# Patient Record
Sex: Female | Born: 1995 | Hispanic: No | Marital: Single | Smoking: Never smoker
Health system: Southern US, Community
[De-identification: ages and names within clinical notes are randomized; demographics above are authoritative.]

---
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
## 2016-09-11 IMAGING — CR DG CHEST 2V
2 series · 2 of 2 positions shown · non-contrast
Comparison: None.

CLINICAL DATA: Chest pain for over a year with minimal shortness of
breath. Postpartum for 2 months.

EXAM:
CHEST  2 VIEW

[w chest pa]
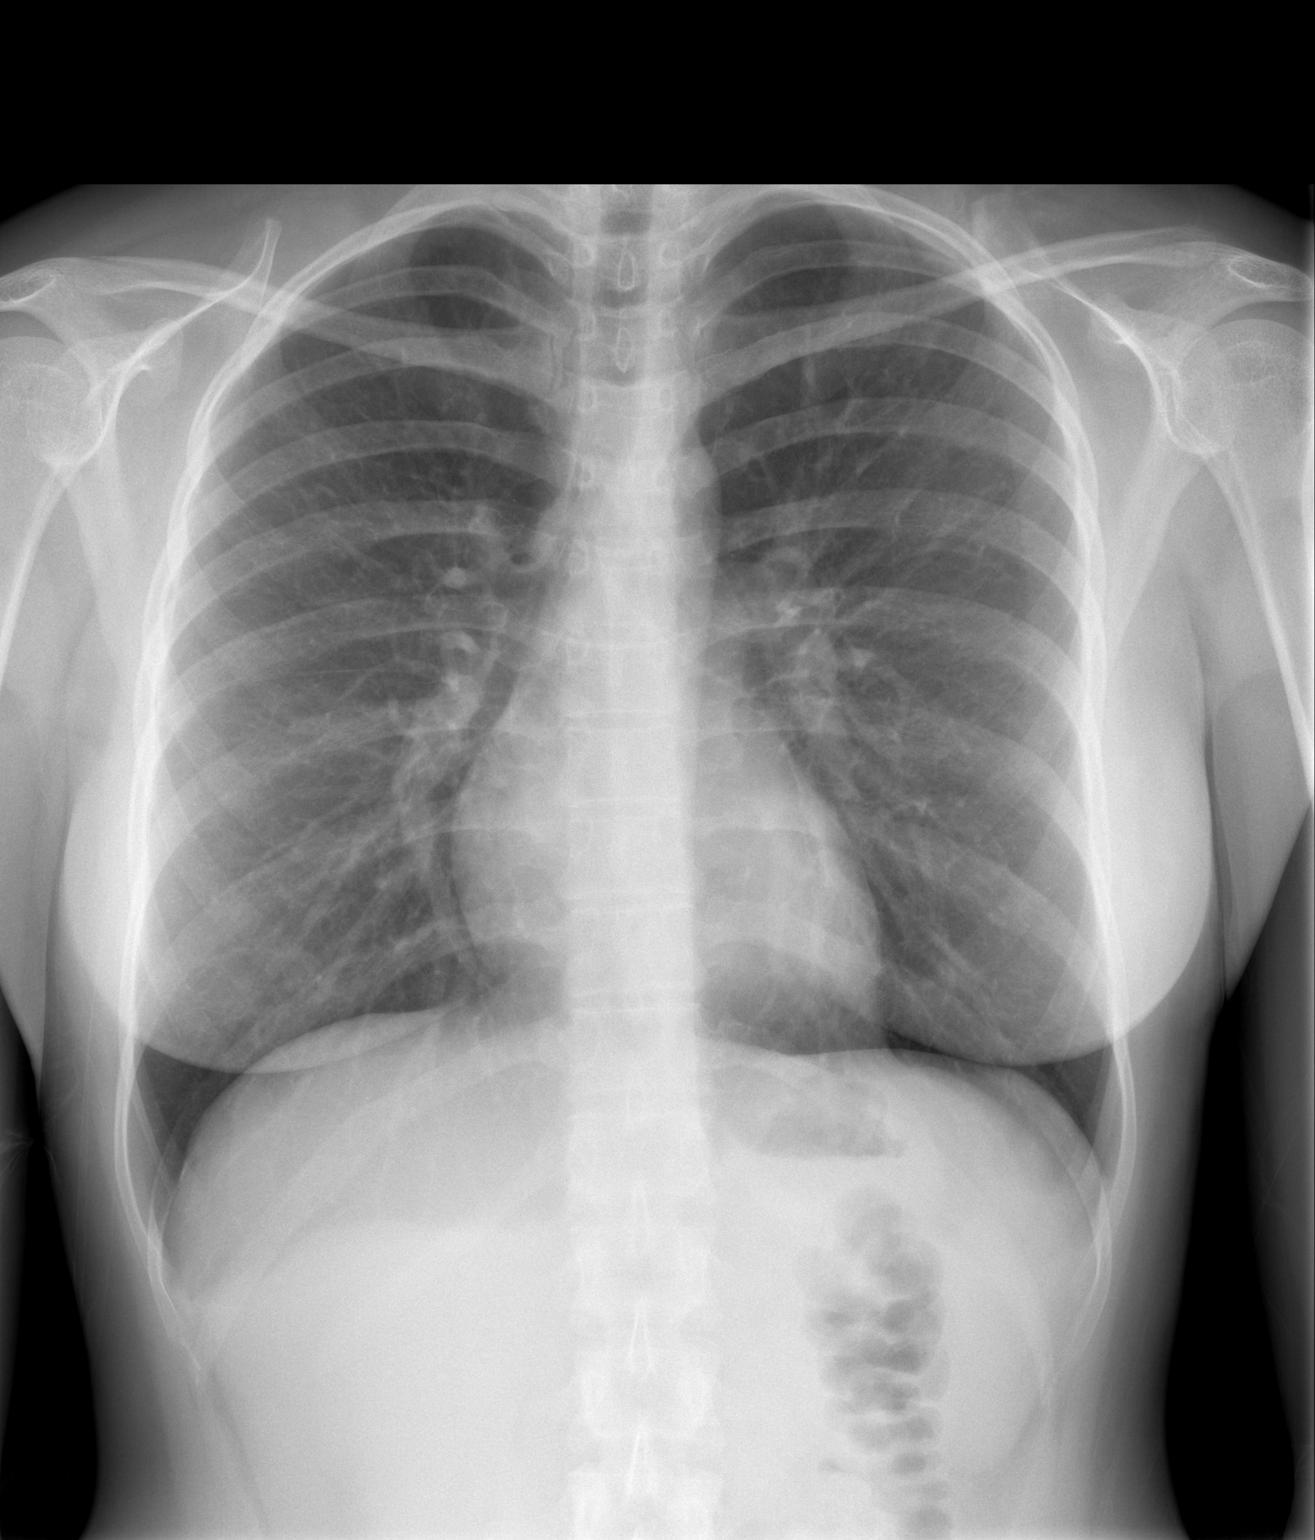

[w chest lat]
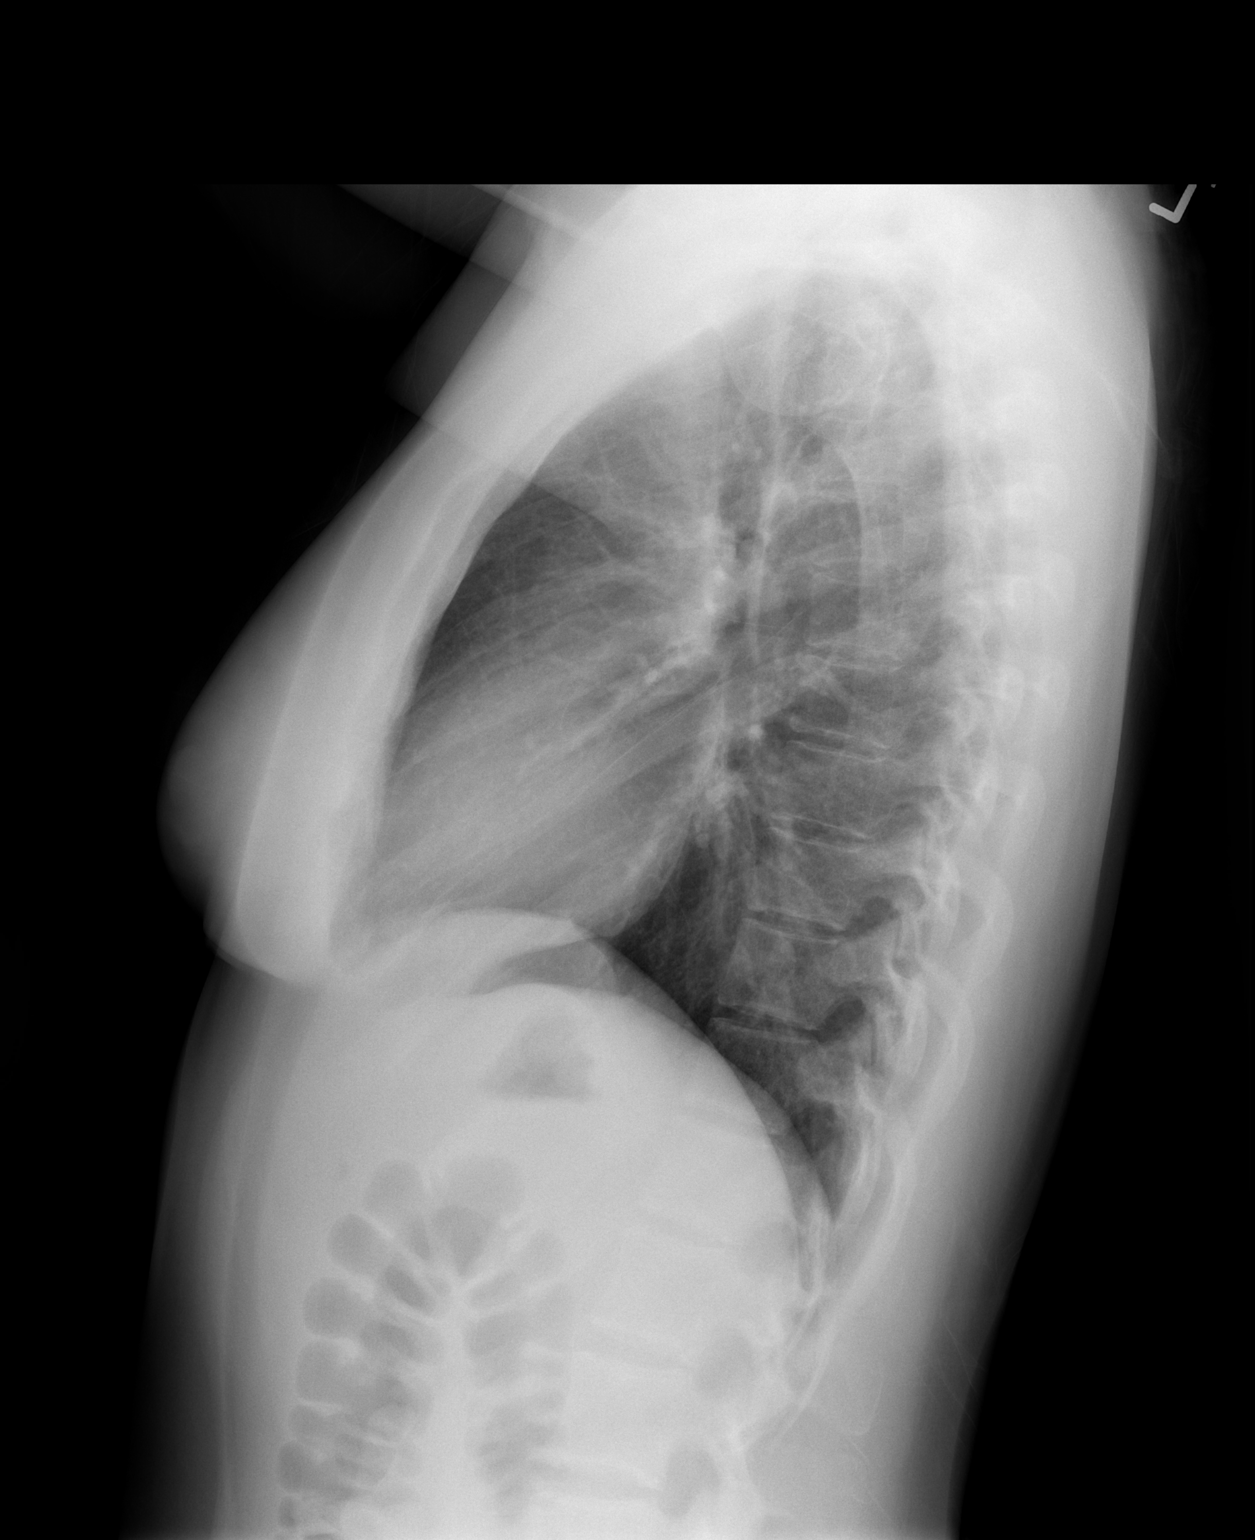

[2 of 2 positions shown; findings below may reference images not displayed]

FINDINGS: The heart size and mediastinal contours are within normal limits.
Both lungs are clear. The visualized skeletal structures are
unremarkable.
IMPRESSION: No active cardiopulmonary disease.

## 2018-08-17 ENCOUNTER — Other Ambulatory Visit: Payer: Self-pay | Admitting: *Deleted

## 2018-08-17 DIAGNOSIS — Z20822 Contact with and (suspected) exposure to covid-19: Secondary | ICD-10-CM

## 2018-08-19 LAB — NOVEL CORONAVIRUS, NAA: SARS-CoV-2, NAA: NOT DETECTED
